# Patient Record
Sex: Male | Born: 1990 | Race: Black or African American | Hispanic: No | Marital: Single | Smoking: Never smoker
Health system: Southern US, Community
[De-identification: ages and names within clinical notes are randomized; demographics above are authoritative.]

## PROBLEM LIST (undated history)

## (undated) DIAGNOSIS — I10 Essential (primary) hypertension: Secondary | ICD-10-CM

---

## 2011-06-04 ENCOUNTER — Emergency Department (HOSPITAL_COMMUNITY)
Admission: EM | Admit: 2011-06-04 | Discharge: 2011-06-04 | Disposition: A | Discharge status: Home / Self Care | Payer: Medicaid Other | Attending: Emergency Medicine | Admitting: Emergency Medicine

## 2011-06-04 ENCOUNTER — Encounter (HOSPITAL_COMMUNITY): Payer: Self-pay | Admitting: *Deleted

## 2011-06-04 DIAGNOSIS — J40 Bronchitis, not specified as acute or chronic: Secondary | ICD-10-CM

## 2011-06-04 DIAGNOSIS — J45909 Unspecified asthma, uncomplicated: Secondary | ICD-10-CM | POA: Insufficient documentation

## 2011-06-04 DIAGNOSIS — I10 Essential (primary) hypertension: Secondary | ICD-10-CM | POA: Insufficient documentation

## 2011-06-04 HISTORY — DX: Essential (primary) hypertension: I10

## 2011-06-04 MED ORDER — ALBUTEROL SULFATE HFA 108 (90 BASE) MCG/ACT IN AERS
2.0000 | INHALATION_SPRAY | RESPIRATORY_TRACT | Status: DC | PRN
  Administered 2011-06-04: 2 via RESPIRATORY_TRACT
  Filled 2011-06-04: qty 6.7

## 2011-06-04 MED ORDER — ALBUTEROL SULFATE (5 MG/ML) 0.5% IN NEBU
5.0000 mg | INHALATION_SOLUTION | Freq: Once | RESPIRATORY_TRACT | Status: AC
  Administered 2011-06-04: 5 mg via RESPIRATORY_TRACT
  Filled 2011-06-04: qty 1

## 2011-06-04 MED ORDER — IPRATROPIUM BROMIDE 0.02 % IN SOLN
0.5000 mg | Freq: Once | RESPIRATORY_TRACT | Status: AC
  Administered 2011-06-04: 0.5 mg via RESPIRATORY_TRACT
  Filled 2011-06-04: qty 2.5

## 2011-06-04 NOTE — Discharge Instructions (Signed)
Bronchitis Bronchitis is the body's way of reacting to injury and/or infection (inflammation) of the bronchi. Bronchi are the air tubes that extend from the windpipe into the lungs. If the inflammation becomes severe, it may cause shortness of breath. CAUSES  Inflammation may be caused by:  A virus.   Germs (bacteria).   Dust.   Allergens.   Pollutants and many other irritants.  The cells lining the bronchial tree are covered with tiny hairs (cilia). These constantly beat upward, away from the lungs, toward the mouth. This keeps the lungs free of pollutants. When these cells become too irritated and are unable to do their job, mucus begins to develop. This causes the characteristic cough of bronchitis. The cough clears the lungs when the cilia are unable to do their job. Without either of these protective mechanisms, the mucus would settle in the lungs. Then you would develop pneumonia. Smoking is a common cause of bronchitis and can contribute to pneumonia. Stopping this habit is the single most important thing you can do to help yourself. TREATMENT   Your caregiver may prescribe an antibiotic if the cough is caused by bacteria. Also, medicines that open up your airways make it easier to breathe. Your caregiver may also recommend or prescribe an expectorant. It will loosen the mucus to be coughed up. Only take over-the-counter or prescription medicines for pain, discomfort, or fever as directed by your caregiver.   Removing whatever causes the problem (smoking, for example) is critical to preventing the problem from getting worse.   Cough suppressants may be prescribed for relief of cough symptoms.   Inhaled medicines may be prescribed to help with symptoms now and to help prevent problems from returning.   For those with recurrent (chronic) bronchitis, there may be a need for steroid medicines.  SEEK IMMEDIATE MEDICAL CARE IF:   During treatment, you develop more pus-like mucus  (purulent sputum).   You have a fever.   Your baby is older than 3 months with a rectal temperature of 102 F (38.9 C) or higher.   Your baby is 71 months old or younger with a rectal temperature of 100.4 F (38 C) or higher.   You become progressively more ill.   You have increased difficulty breathing, wheezing, or shortness of breath.  It is necessary to seek immediate medical care if you are elderly or sick from any other disease. MAKE SURE YOU:   Understand these instructions.   Will watch your condition.   Will get help right away if you are not doing well or get worse.  Document Released: 02/12/2005 Document Revised: 02/01/2011 Document Reviewed: 12/23/2007 [facility] Patient Information 2012 [place], [place].    RESOURCE GUIDE  Dental Problems  Patients with Medicaid: [facility] [phone] [address]                                           [phone] [address] Phone:  [phone]                                                  Phone:  [phone]  If unable to pay or uninsured, contact:  [facility] or [facility]. to become qualified for the adult dental clinic.  Chronic Pain Problems Contact [facility]  [phone] Patients need to be referred by their primary care doctor.  Insufficient Money for Medicine Contact [employer] Way:  call "211" or [facility] [phone]) [phone].  No Primary Care Doctor Call [facility]  541-342-3405 Other agencies that provide inexpensive medical care    [facility]  [phone]    [facility]  [phone]    [facility]  ([phone]    [facility]  [phone]    [facility]  858-705-2845    [facility]  [phone]  Psychological Services [facility]  [phone] [facility] Services  [phone] [facility]   2608702974 (emergency services 2281971172)  Substance Abuse  Resources [facility] and Drug Services  404-271-5750 [facility] (713)006-3603 [facility] [place] 412-501-5755 Floydene Flock 430-735-9336 Residential & Outpatient Substance Abuse Program  407-447-6732  Abuse/Neglect [facility] Child Abuse Hotline (825)210-6015 [facility] Child Abuse Hotline (478)095-9902 (After Hours)  Emergency Shelter [facility] 680-502-5119  M[facility] 704-192-9139 [facility] [phone]  MRSA Hotline #:   (260) 734-5431    [facility] Resources  [facility]     [employer]                          [facility] Dept. [address] [place]                       [address]      [address]  Blondell Reveal Phone:  [phone]                                   Phone:  [phone]                 Phone:  [phone]  [facility] Phone:  [phone]  [facility] Child Abuse Hotline 940-630-4891 210-206-4693 (After Hours)

## 2011-06-04 NOTE — ED Notes (Signed)
Pt  C/o a cough cold aching all over and chest congestion for 24 hours

## 2011-06-04 NOTE — ED Provider Notes (Signed)
History     CSN: [id number]  Arrival date & time 06/04/11  1854   First MD Initiated Contact with Patient 06/04/11 2304      Chief Complaint  Patient presents with  . Cough    HPI  History provided by the patient and significant other. Patient is a 21 year old male with history of asthma and hypertension presents with complaints of nasal congestion, sore throat and productive cough and began early this morning. Patient states that he had similar symptoms 2 weeks ago but they resolved. Symptoms began again acutely this morning. Patient took some NyQuil with relief of symptoms. Patient does report that his girlfriend has been sick with similar symptoms last week. Patient denies any other known sick contacts. Patient has not used any of his albuterol or asthma medications. Patient denies any associated fever, chills, sweats. Patient denies any nausea vomiting diarrhea. Symptoms are described as moderate. Patient denies any aggravating or alleviating factors.      Past Medical History  Diagnosis Date  . Asthma   . Hypertension     History reviewed. No pertinent past surgical history.  History reviewed. No pertinent family history.  History  Substance Use Topics  . Smoking status: Never Smoker   . Smokeless tobacco: Not on file  . Alcohol Use: No      Review of Systems  Constitutional: Positive for fatigue. Negative for fever, chills and appetite change.  HENT: Positive for congestion, sore throat and rhinorrhea. Negative for ear pain.   Respiratory: Positive for cough and shortness of breath.   Cardiovascular: Negative for chest pain.  Gastrointestinal: Negative for nausea, vomiting, abdominal pain and diarrhea.  Musculoskeletal: Positive for myalgias.  Skin: Negative for rash.    Allergies  Review of patient's allergies indicates no known allergies.  Home Medications  No current outpatient prescriptions on file.  BP 136/48  Pulse 67  Temp(Src) 98.5 F (36.9 C)  (Oral)  Resp 16  SpO2 100%  Physical Exam  Nursing note and vitals reviewed. Constitutional: He is oriented to person, place, and time. He appears well-developed and well-nourished. No distress.  HENT:  Head: Normocephalic and atraumatic.       Pharynx erythematous. No exudate. Tonsils normal size without swelling or exudate. Slight nasal congestion.  Eyes: Conjunctivae and EOM are normal. Pupils are equal, round, and reactive to light.  Neck: Normal range of motion. Neck supple.  Cardiovascular: Normal rate and regular rhythm.   Pulmonary/Chest: Effort normal. No respiratory distress. He has wheezes. He has no rales.  Abdominal: Soft. He exhibits no distension. There is no tenderness.  Lymphadenopathy:    He has no cervical adenopathy.  Neurological: He is alert and oriented to person, place, and time.  Skin: Skin is warm. No rash noted.  Psychiatric: He has a normal mood and affect. His behavior is normal.    ED Course  Procedures     1. Bronchitis       MDM  11:05 PM patient seen and evaluated. Patient in no acute distress.        Angus Seller, [place] 06/05/11 1913

## 2011-06-06 NOTE — ED Provider Notes (Signed)
Medical screening examination/treatment/procedure(s) were performed by non-physician practitioner and as supervising physician I was immediately available for consultation/collaboration.   Vida Roller, MD 06/06/11 [phone]

## 2013-10-22 ENCOUNTER — Emergency Department (HOSPITAL_COMMUNITY): Payer: Self-pay

## 2013-10-22 ENCOUNTER — Encounter (HOSPITAL_COMMUNITY): Payer: Self-pay | Admitting: Emergency Medicine

## 2013-10-22 ENCOUNTER — Emergency Department (HOSPITAL_COMMUNITY): Payer: Medicaid Other

## 2013-10-22 ENCOUNTER — Emergency Department (HOSPITAL_COMMUNITY)
Admission: EM | Admit: 2013-10-22 | Discharge: 2013-10-22 | Disposition: A | Discharge status: Home / Self Care | Payer: Self-pay | Attending: Emergency Medicine | Admitting: Emergency Medicine

## 2013-10-22 DIAGNOSIS — Y929 Unspecified place or not applicable: Secondary | ICD-10-CM | POA: Insufficient documentation

## 2013-10-22 DIAGNOSIS — S99929A Unspecified injury of unspecified foot, initial encounter: Secondary | ICD-10-CM

## 2013-10-22 DIAGNOSIS — S93609A Unspecified sprain of unspecified foot, initial encounter: Secondary | ICD-10-CM | POA: Insufficient documentation

## 2013-10-22 DIAGNOSIS — S93601A Unspecified sprain of right foot, initial encounter: Secondary | ICD-10-CM

## 2013-10-22 DIAGNOSIS — S8990XA Unspecified injury of unspecified lower leg, initial encounter: Secondary | ICD-10-CM | POA: Insufficient documentation

## 2013-10-22 DIAGNOSIS — X58XXXA Exposure to other specified factors, initial encounter: Secondary | ICD-10-CM | POA: Insufficient documentation

## 2013-10-22 DIAGNOSIS — Y939 Activity, unspecified: Secondary | ICD-10-CM | POA: Insufficient documentation

## 2013-10-22 DIAGNOSIS — I10 Essential (primary) hypertension: Secondary | ICD-10-CM | POA: Insufficient documentation

## 2013-10-22 DIAGNOSIS — S99919A Unspecified injury of unspecified ankle, initial encounter: Secondary | ICD-10-CM

## 2013-10-22 DIAGNOSIS — J45909 Unspecified asthma, uncomplicated: Secondary | ICD-10-CM | POA: Insufficient documentation

## 2013-10-22 MED ORDER — HYDROCODONE-ACETAMINOPHEN 5-325 MG PO TABS: 1.0000 | ORAL_TABLET | Freq: Four times a day (QID) | ORAL | Status: AC | PRN

## 2013-10-22 MED ORDER — HYDROCODONE-ACETAMINOPHEN 5-325 MG PO TABS
1.0000 | ORAL_TABLET | Freq: Once | ORAL | Status: AC
  Administered 2013-10-22: 1 via ORAL
  Filled 2013-10-22: qty 1

## 2013-10-22 NOTE — ED Notes (Signed)
Pt reports R foot injury on Monday. Swelling noted to extremity. Pt has brace on.

## 2013-10-22 NOTE — ED Provider Notes (Signed)
Medical screening examination/treatment/procedure(s) were performed by non-physician practitioner and as supervising physician I was immediately available for consultation/collaboration.    Vida Roller, MD 10/22/13 0630

## 2013-10-22 NOTE — Discharge Instructions (Signed)
Foot Sprain The muscles and cord like structures which attach muscle to bone (tendons) that surround the feet are made up of units. A foot sprain can occur at the weakest spot in any of these units. This condition is most often caused by injury to or overuse of the foot, as from playing contact sports, or aggravating a previous injury, or from poor conditioning, or obesity. SYMPTOMS  Pain with movement of the foot.  Tenderness and swelling at the injury site.  Loss of strength is present in moderate or severe sprains. THE THREE GRADES OR SEVERITY OF FOOT SPRAIN ARE:  Mild (Grade I): Slightly pulled muscle without tearing of muscle or tendon fibers or loss of strength.  Moderate (Grade II): Tearing of fibers in a muscle, tendon, or at the attachment to bone, with small decrease in strength.  Severe (Grade III): Rupture of the muscle-tendon-bone attachment, with separation of fibers. Severe sprain requires surgical repair. Often repeating (chronic) sprains are caused by overuse. Sudden (acute) sprains are caused by direct injury or over-use. DIAGNOSIS  Diagnosis of this condition is usually by your own observation. If problems continue, a caregiver may be required for further evaluation and treatment. X-rays may be required to make sure there are not breaks in the bones (fractures) present. Continued problems may require physical therapy for treatment. PREVENTION  Use strength and conditioning exercises appropriate for your sport.  Warm up properly prior to working out.  Use athletic shoes that are made for the sport you are participating in.  Allow adequate time for healing. Early return to activities makes repeat injury more likely, and can lead to an unstable arthritic foot that can result in prolonged disability. Mild sprains generally heal in 3 to 10 days, with moderate and severe sprains taking 2 to 10 weeks. Your caregiver can help you determine the proper time required for  healing. HOME CARE INSTRUCTIONS   Apply ice to the injury for 15-20 minutes, 03-04 times per day. Put the ice in a plastic bag and place a towel between the bag of ice and your skin.  An elastic wrap (like an Ace bandage) may be used to keep swelling down.  Keep foot above the level of the heart, or at least raised on a footstool, when swelling and pain are present.  Try to avoid use other than gentle range of motion while the foot is painful. Do not resume use until instructed by your caregiver. Then begin use gradually, not increasing use to the point of pain. If pain does develop, decrease use and continue the above measures, gradually increasing activities that do not cause discomfort, until you gradually achieve normal use.  Use crutches if and as instructed, and for the length of time instructed.  Keep injured foot and ankle wrapped between treatments.  Massage foot and ankle for comfort and to keep swelling down. Massage from the toes up towards the knee.  Only take over-the-counter or prescription medicines for pain, discomfort, or fever as directed by your caregiver. SEEK IMMEDIATE MEDICAL CARE IF:   Your pain and swelling increase, or pain is not controlled with medications.  You have loss of feeling in your foot or your foot turns cold or blue.  You develop new, unexplained symptoms, or an increase of the symptoms that brought you to your caregiver. MAKE SURE YOU:   Understand these instructions.  Will watch your condition.  Will get help right away if you are not doing well or get worse. Document Released:   08/04/2001 Document Revised: 05/07/2011 Document Reviewed: 10/02/2007 [employer] Patient Information 2015 [employer]. This information is not intended to replace advice given to you by your health care provider. Make sure you discuss any questions you have with your health care provider.  

## 2013-10-22 NOTE — Progress Notes (Signed)
Orthopedic Tech Progress Note Patient Details:  Charles Rocha 12-22-1990 [id number] Applied ASO to RLE.  Pulses, sensation, motion intact before and after application.  Capillary refill less than 2 seconds before and after application.  Fit crutches and taught pt. use of same. Ortho Devices Type of Ortho Device: ASO Ortho Device/Splint Location: RLE Ortho Device/Splint Interventions: Application   [name] 10/22/2013, 5:22 AM

## 2013-10-22 NOTE — ED Provider Notes (Signed)
CSN: [id number]     Arrival date & time 10/22/13  0156 History   First MD Initiated Contact with Patient 10/22/13 0304     Chief Complaint  Patient presents with  . Foot Injury     (Consider location/radiation/quality/duration/timing/severity/associated sxs/prior Treatment) HPI Comments: Rolled his ankle on Monday playing BB has been using RICE and Ibuprofen with little relief  Now with increases swelling and pain   Patient is a 23 y.o. male presenting with foot injury. The history is provided by the patient.  Foot Injury Location:  Ankle and foot Time since incident:  4 days Injury: yes   Mechanism of injury: fall   Fall:    Fall occurred:  Standing   Impact surface:  Athletic surface   Entrapped after fall: no   Ankle location:  R ankle Foot location:  R foot Pain details:    Quality:  Aching and throbbing   Radiates to:  Does not radiate   Severity:  Moderate   Onset quality:  Sudden   Duration:  4 days   Timing:  Constant   Progression:  Worsening Chronicity:  New Dislocation: no   Foreign body present:  No foreign bodies Prior injury to area:  Yes Relieved by:  Nothing Worsened by:  Activity Ineffective treatments:  Ice, immobilization, rest, elevation and compression Associated symptoms: swelling   Associated symptoms: no fever     Past Medical History  Diagnosis Date  . Asthma   . Hypertension    History reviewed. No pertinent past surgical history. No family history on file. History  Substance Use Topics  . Smoking status: Never Smoker   . Smokeless tobacco: Not on file  . Alcohol Use: No    Review of Systems  Constitutional: Negative for fever.  Musculoskeletal: Positive for joint swelling.  Skin: Negative for wound.  Neurological: Negative for dizziness and numbness.  All other systems reviewed and are negative.     Allergies  Review of patient's allergies indicates no known allergies.  Home Medications   Prior to Admission medications    Medication Sig Start Date End Date Taking? Authorizing Provider  albuterol (PROVENTIL HFA;VENTOLIN HFA) 108 (90 BASE) MCG/ACT inhaler Inhale 1 puff into the lungs every 6 (six) hours as needed for wheezing or shortness of breath.   Yes Historical Provider, MD  Aspirin-Salicylamide-Caffeine (BC HEADACHE PO) Take 1 packet by mouth every 6 (six) hours as needed (for pain).   Yes Historical Provider, MD  HYDROcodone-acetaminophen (NORCO/VICODIN) 5-325 MG per tablet Take 1 tablet by mouth every 6 (six) hours as needed for moderate pain. 10/22/13   Arman Filter, NP   BP 142/61  Pulse 50  Temp(Src) 97.5 F (36.4 C) (Oral)  Resp 18  SpO2 99% Physical Exam  Nursing note and vitals reviewed. Constitutional: He appears well-developed and well-nourished.  HENT:  Head: Normocephalic.  Eyes: Pupils are equal, round, and reactive to light.  Neck: Normal range of motion.  Cardiovascular: Normal rate.   Pulmonary/Chest: Effort normal.  Musculoskeletal: He exhibits tenderness. He exhibits no edema.       Right ankle: He exhibits decreased range of motion and swelling. He exhibits no deformity, no laceration and normal pulse. Tenderness. Lateral malleolus and medial malleolus tenderness found. Achilles tendon normal. Achilles tendon exhibits no pain.  Neurological: He is alert.  Skin: Skin is warm. No erythema.    ED Course  Procedures (including critical care time) Labs Review Labs Reviewed - No data to display  Imaging  Review Dg Ankle Complete Right  10/22/2013   CLINICAL DATA:  Ankle pain after injury.  EXAM: RIGHT ANKLE - COMPLETE 3+ VIEW  COMPARISON:  None.  FINDINGS: Old appearing ununited ossicle inferior to the lateral malleolus. Mild soft tissue swelling. Multiple bone fragments over the distal talus may represent avulsion fragments. Overlying soft tissue swelling is present. Plantar calcaneal spur. No displaced fractures identified. Ankle mortise and talar dome appear intact.  IMPRESSION:  Osseous fragments over the distal talus may represent avulsion fracture fragments. Soft tissue swelling.   Electronically Signed   By: Burman Nieves M.D.   On: 10/22/2013 03:40   Dg Foot Complete Right  10/22/2013   CLINICAL DATA:  Right foot pain following injury.  EXAM: RIGHT FOOT COMPLETE - 3+ VIEW  COMPARISON:  None.  FINDINGS: Small dorsal bone fragments at the level of the distal talus and proximal navicular. These appear corticated without overlying focal soft tissue swelling. There is mild diffuse dorsal soft tissue swelling, more pronounced distally. Mild inferior calcaneal spur formation.  IMPRESSION: Probable old fracture fragments dorsal to the proximal tarsal bones. No definite acute fracture.   Electronically Signed   By: Gordan Payment M.D.   On: 10/22/2013 02:42     EKG Interpretation None      MDM   Final diagnoses:  Foot sprain, right, initial encounter   Patient has been placed in ASO for support given Rx for Hydrocodone  and crutches       Arman Filter, NP 10/22/13 7829  Arman Filter, NP 10/22/13 [phone]

## 2013-10-27 ENCOUNTER — Telehealth (HOSPITAL_COMMUNITY): Payer: Self-pay

## 2013-10-27 ENCOUNTER — Encounter (HOSPITAL_COMMUNITY): Payer: Self-pay | Admitting: Emergency Medicine

## 2013-10-27 ENCOUNTER — Emergency Department (HOSPITAL_COMMUNITY)
Admission: EM | Admit: 2013-10-27 | Discharge: 2013-10-27 | Disposition: A | Discharge status: Home / Self Care | Payer: Medicaid Other | Attending: Emergency Medicine | Admitting: Emergency Medicine

## 2013-10-27 DIAGNOSIS — M25571 Pain in right ankle and joints of right foot: Secondary | ICD-10-CM

## 2013-10-27 DIAGNOSIS — Z7982 Long term (current) use of aspirin: Secondary | ICD-10-CM | POA: Insufficient documentation

## 2013-10-27 DIAGNOSIS — M25579 Pain in unspecified ankle and joints of unspecified foot: Secondary | ICD-10-CM | POA: Insufficient documentation

## 2013-10-27 DIAGNOSIS — Z79899 Other long term (current) drug therapy: Secondary | ICD-10-CM | POA: Insufficient documentation

## 2013-10-27 DIAGNOSIS — Z0289 Encounter for other administrative examinations: Secondary | ICD-10-CM | POA: Insufficient documentation

## 2013-10-27 DIAGNOSIS — I10 Essential (primary) hypertension: Secondary | ICD-10-CM | POA: Insufficient documentation

## 2013-10-27 DIAGNOSIS — J45909 Unspecified asthma, uncomplicated: Secondary | ICD-10-CM | POA: Insufficient documentation

## 2013-10-27 MED ORDER — HYDROCODONE-ACETAMINOPHEN 5-325 MG PO TABS
1.0000 | ORAL_TABLET | Freq: Once | ORAL | Status: AC
  Administered 2013-10-27: 1 via ORAL
  Filled 2013-10-27: qty 1
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
## 2013-10-27 NOTE — Discharge Instructions (Signed)
Wear ankle brace for at least 2 weeks for stabilization of ankle. Use crutches as needed for comfort. Ice and elevate ankle throughout the day. Alternate between ibuprofen and tylenol for pain. Call orthopedic follow up as directed by the provider you saw previously. Return to the ER for changes or worsening symptoms.    Ankle Pain Ankle pain is a common symptom. The bones, cartilage, tendons, and muscles of the ankle joint perform a lot of work each day. The ankle joint holds your body weight and allows you to move around. Ankle pain can occur on either side or back of 1 or both ankles. Ankle pain may be sharp and burning or dull and aching. There may be tenderness, stiffness, redness, or warmth around the ankle. The pain occurs more often when a person walks or puts pressure on the ankle. CAUSES  There are many reasons ankle pain can develop. It is important to work with your caregiver to identify the cause since many conditions can impact the bones, cartilage, muscles, and tendons. Causes for ankle pain include:  Injury, including a break (fracture), sprain, or strain often due to a fall, sports, or a high-impact activity.  Swelling (inflammation) of a tendon (tendonitis).  Achilles tendon rupture.  Ankle instability after repeated sprains and strains.  Poor foot alignment.  Pressure on a nerve (tarsal tunnel syndrome).  Arthritis in the ankle or the lining of the ankle.  Crystal formation in the ankle (gout or pseudogout). DIAGNOSIS  A diagnosis is based on your medical history, your symptoms, results of your physical exam, and results of diagnostic tests. Diagnostic tests may include X-ray exams or a computerized magnetic scan (magnetic resonance imaging, MRI). TREATMENT  Treatment will depend on the cause of your ankle pain and may include:  Keeping pressure off the ankle and limiting activities.  Using crutches or other walking support (a cane or brace).  Using rest, ice,  compression, and elevation.  Participating in physical therapy or home exercises.  Wearing shoe inserts or special shoes.  Losing weight.  Taking medications to reduce pain or swelling or receiving an injection.  Undergoing surgery. HOME CARE INSTRUCTIONS   Only take over-the-counter or prescription medicines for pain, discomfort, or fever as directed by your caregiver.  Put ice on the injured area.  Put ice in a plastic bag.  Place a towel between your skin and the bag.  Leave the ice on for 15-20 minutes at a time, 03-04 times a day.  Keep your leg raised (elevated) when possible to lessen swelling.  Avoid activities that cause ankle pain.  Follow specific exercises as directed by your caregiver.  Record how often you have ankle pain, the location of the pain, and what it feels like. This information may be helpful to you and your caregiver.  Ask your caregiver about returning to work or sports and whether you should drive.  Follow up with your caregiver for further examination, therapy, or testing as directed. SEEK MEDICAL CARE IF:   Pain or swelling continues or worsens beyond 1 week.  You have an oral temperature above 102 F (38.9 C).  You are feeling unwell or have chills.  You are having an increasingly difficult time with walking.  You have loss of sensation or other new symptoms.  You have questions or concerns. MAKE SURE YOU:   Understand these instructions.  Will watch your condition.  Will get help right away if you are not doing well or get worse. Document Released:  08/02/2009 Document Revised: 05/07/2011 Document Reviewed: 08/02/2009 ExitCare Patient Information 2015 [place], [place]. This information is not intended to replace advice given to you by your health care provider. Make sure you discuss any questions you have with your health care provider.  Cryotherapy Cryotherapy is when you put ice on your injury. Ice helps lessen pain and  puffiness (swelling) after an injury. Ice works the best when you start using it in the first 24 to 48 hours after an injury. HOME CARE  Put a dry or damp towel between the ice pack and your skin.  You may press gently on the ice pack.  Leave the ice on for no more than 10 to 20 minutes at a time.  Check your skin after 5 minutes to make sure your skin is okay.  Rest at least 20 minutes between ice pack uses.  Stop using ice when your skin loses feeling (numbness).  Do not use ice on someone who cannot tell you when it hurts. This includes small children and people with memory problems (dementia). GET HELP RIGHT AWAY IF:  You have white spots on your skin.  Your skin turns blue or pale.  Your skin feels waxy or hard.  Your puffiness gets worse. MAKE SURE YOU:   Understand these instructions.  Will watch your condition.  Will get help right away if you are not doing well or get worse. Document Released: 08/01/2007 Document Revised: 05/07/2011 Document Reviewed: 10/05/2010 [facility] Patient Information 2015 [place], [place]. This information is not intended to replace advice given to you by your health care provider. Make sure you discuss any questions you have with your health care provider.

## 2013-10-27 NOTE — ED Notes (Signed)
Pt seen here for right foot injury on 8/27. States she was given a work note that covered him until today, but pt doesn't feel like he is able to return at this time. Pt requesting a longer work note. Pt denies any changes in foot. States he never filled pain prescription because he was unable to afford it. Pt in NAD. AO x 4.

## 2013-10-28 NOTE — ED Provider Notes (Signed)
Medical screening examination/treatment/procedure(s) were performed by non-physician practitioner and as supervising physician I was immediately available for consultation/collaboration.   EKG Interpretation None       Shamir Tuzzolino, MD 10/28/13 0153 

## 2013-11-10 ENCOUNTER — Telehealth (HOSPITAL_BASED_OUTPATIENT_CLINIC_OR_DEPARTMENT_OTHER): Payer: Self-pay | Admitting: Emergency Medicine

## 2014-02-06 ENCOUNTER — Emergency Department (HOSPITAL_COMMUNITY)
Admission: EM | Admit: 2014-02-06 | Discharge: 2014-02-06 | Disposition: A | Discharge status: Home / Self Care | Payer: Medicaid Other | Attending: Emergency Medicine | Admitting: Emergency Medicine

## 2014-02-06 ENCOUNTER — Encounter (HOSPITAL_COMMUNITY): Payer: Self-pay | Admitting: Emergency Medicine

## 2014-02-06 DIAGNOSIS — Y998 Other external cause status: Secondary | ICD-10-CM | POA: Insufficient documentation

## 2014-02-06 DIAGNOSIS — R001 Bradycardia, unspecified: Secondary | ICD-10-CM | POA: Insufficient documentation

## 2014-02-06 DIAGNOSIS — S0181XA Laceration without foreign body of other part of head, initial encounter: Secondary | ICD-10-CM

## 2014-02-06 DIAGNOSIS — J45909 Unspecified asthma, uncomplicated: Secondary | ICD-10-CM | POA: Insufficient documentation

## 2014-02-06 DIAGNOSIS — I10 Essential (primary) hypertension: Secondary | ICD-10-CM | POA: Insufficient documentation

## 2014-02-06 DIAGNOSIS — W01198A Fall on same level from slipping, tripping and stumbling with subsequent striking against other object, initial encounter: Secondary | ICD-10-CM | POA: Insufficient documentation

## 2014-02-06 DIAGNOSIS — W108XXA Fall (on) (from) other stairs and steps, initial encounter: Secondary | ICD-10-CM | POA: Insufficient documentation

## 2014-02-06 DIAGNOSIS — Z79899 Other long term (current) drug therapy: Secondary | ICD-10-CM | POA: Insufficient documentation

## 2014-02-06 DIAGNOSIS — S40211A Abrasion of right shoulder, initial encounter: Secondary | ICD-10-CM | POA: Insufficient documentation

## 2014-02-06 DIAGNOSIS — W25XXXA Contact with sharp glass, initial encounter: Secondary | ICD-10-CM | POA: Insufficient documentation

## 2014-02-06 DIAGNOSIS — T07XXXA Unspecified multiple injuries, initial encounter: Secondary | ICD-10-CM

## 2014-02-06 DIAGNOSIS — Z23 Encounter for immunization: Secondary | ICD-10-CM | POA: Insufficient documentation

## 2014-02-06 DIAGNOSIS — S01411A Laceration without foreign body of right cheek and temporomandibular area, initial encounter: Secondary | ICD-10-CM | POA: Insufficient documentation

## 2014-02-06 DIAGNOSIS — Y9389 Activity, other specified: Secondary | ICD-10-CM | POA: Insufficient documentation

## 2014-02-06 DIAGNOSIS — Y9289 Other specified places as the place of occurrence of the external cause: Secondary | ICD-10-CM | POA: Insufficient documentation

## 2014-02-06 MED ORDER — IBUPROFEN 400 MG PO TABS
600.0000 mg | ORAL_TABLET | Freq: Once | ORAL | Status: AC
  Administered 2014-02-06: 600 mg via ORAL
  Filled 2014-02-06 (×2): qty 1

## 2014-02-06 MED ORDER — TRAMADOL HCL 50 MG PO TABS: 50.0000 mg | ORAL_TABLET | Freq: Four times a day (QID) | ORAL | Status: AC | PRN

## 2014-02-06 MED ORDER — TETANUS-DIPHTH-ACELL PERTUSSIS 5-2.5-18.5 LF-MCG/0.5 IM SUSP
0.5000 mL | Freq: Once | INTRAMUSCULAR | Status: AC
  Administered 2014-02-06: 0.5 mL via INTRAMUSCULAR
  Filled 2014-02-06: qty 0.5

## 2014-02-06 MED ORDER — LORAZEPAM 1 MG PO TABS
1.0000 mg | ORAL_TABLET | Freq: Once | ORAL | Status: AC
  Administered 2014-02-06: 1 mg via ORAL
  Filled 2014-02-06: qty 1

## 2014-02-06 MED ORDER — OXYCODONE-ACETAMINOPHEN 5-325 MG PO TABS
2.0000 | ORAL_TABLET | Freq: Once | ORAL | Status: AC
  Administered 2014-02-06: 2 via ORAL
  Filled 2014-02-06: qty 2

## 2014-02-06 MED ORDER — LIDOCAINE-EPINEPHRINE (PF) 2 %-1:200000 IJ SOLN
10.0000 mL | Freq: Once | INTRAMUSCULAR | Status: AC
  Administered 2014-02-06: 10 mL
  Filled 2014-02-06: qty 20

## 2014-02-06 MED ORDER — BACITRACIN ZINC 500 UNIT/GM EX OINT
TOPICAL_OINTMENT | Freq: Two times a day (BID) | CUTANEOUS | Status: DC
Start: 2014-02-06 — End: 2014-02-06
  Administered 2014-02-06: 1 via TOPICAL

## 2014-02-06 NOTE — ED Notes (Addendum)
Pt reports tripping and falling down stairs over some trash at the bottom of the stairs this morning.  Pt reports he tried to catch himself.  Pt reports he fell on some broken glass bottles.  Pt has abrasion to right shoulder, on posterior hands, and under lower left lip, on upper left lip, abover upper left lip, and a gash about 3cm long under right eye. Pt alert and oriented. Pt reports he partied last night. Bleeding is controlled.

## 2014-02-06 NOTE — Discharge Instructions (Signed)
Facial Laceration ° A facial laceration is a cut on the face. These injuries can be painful and cause bleeding. Lacerations usually heal quickly, but they need special care to reduce scarring. °DIAGNOSIS  °Your health care provider will take a medical history, ask for details about how the injury occurred, and examine the wound to determine how deep the cut is. °TREATMENT  °Some facial lacerations may not require closure. Others may not be able to be closed because of an increased risk of infection. The risk of infection and the chance for successful closure will depend on various factors, including the amount of time since the injury occurred. °The wound may be cleaned to help prevent infection. If closure is appropriate, pain medicines may be given if needed. Your health care provider will use stitches (sutures), wound glue (adhesive), or skin adhesive strips to repair the laceration. These tools bring the skin edges together to allow for faster healing and a better cosmetic outcome. If needed, you may also be given a tetanus shot. °HOME CARE INSTRUCTIONS °· Only take over-the-counter or prescription medicines as directed by your health care provider. °· Follow your health care provider's instructions for wound care. These instructions will vary depending on the technique used for closing the wound. °For Sutures: °· Keep the wound clean and dry.   °· If you were given a bandage (dressing), you should change it at least once a day. Also change the dressing if it becomes wet or dirty, or as directed by your health care provider.   °· Wash the wound with soap and water 2 times a day. Rinse the wound off with water to remove all soap. Pat the wound dry with a clean towel.   °· After cleaning, apply a thin layer of the antibiotic ointment recommended by your health care provider. This will help prevent infection and keep the dressing from sticking.   °· You may shower as usual after the first 24 hours. Do not soak the  wound in water until the sutures are removed.   °· Get your sutures removed as directed by your health care provider. With facial lacerations, sutures should usually be taken out after 4-5 days to avoid stitch marks.   °· Wait a few days after your sutures are removed before applying any makeup. °For Skin Adhesive Strips: °· Keep the wound clean and dry.   °· Do not get the skin adhesive strips wet. You may bathe carefully, using caution to keep the wound dry.   °· If the wound gets wet, pat it dry with a clean towel.   °· Skin adhesive strips will fall off on their own. You may trim the strips as the wound heals. Do not remove skin adhesive strips that are still stuck to the wound. They will fall off in time.   °For Wound Adhesive: °· You may briefly wet your wound in the shower or bath. Do not soak or scrub the wound. Do not swim. Avoid periods of heavy sweating until the skin adhesive has fallen off on its own. After showering or bathing, gently pat the wound dry with a clean towel.   °· Do not apply liquid medicine, cream medicine, ointment medicine, or makeup to your wound while the skin adhesive is in place. This may loosen the film before your wound is healed.   °· If a dressing is placed over the wound, be careful not to apply tape directly over the skin adhesive. This may cause the adhesive to be pulled off before the wound is healed.   °· Avoid   prolonged exposure to sunlight or tanning lamps while the skin adhesive is in place.  The skin adhesive will usually remain in place for 5-10 days, then naturally fall off the skin. Do not pick at the adhesive film.  After Healing: Once the wound has healed, cover the wound with sunscreen during the day for 1 full year. This can help minimize scarring. Exposure to ultraviolet light in the first year will darken the scar. It can take 1-2 years for the scar to lose its redness and to heal completely.  SEEK IMMEDIATE MEDICAL CARE IF:  You have redness, pain, or  swelling around the wound.   You see ayellowish-white fluid (pus) coming from the wound.   You have chills or a fever.  MAKE SURE YOU:  Understand these instructions.  Will watch your condition.  Will get help right away if you are not doing well or get worse. Document Released: 03/22/2004 Document Revised: 12/03/2012 Document Reviewed: 09/25/2012 [facility] Patient Information 2015 [place], [place]. This information is not intended to replace advice given to you by your health care provider. Make sure you discuss any questions you have with your health care provider.  Abrasion An abrasion is a cut or scrape of the skin. Abrasions do not extend through all layers of the skin and most heal within 10 days. It is important to care for your abrasion properly to prevent infection. CAUSES  Most abrasions are caused by falling on, or gliding across, the ground or other surface. When your skin rubs on something, the outer and inner layer of skin rubs off, causing an abrasion. DIAGNOSIS  Your caregiver will be able to diagnose an abrasion during a physical exam.  TREATMENT  Your treatment depends on how large and deep the abrasion is. Generally, your abrasion will be cleaned with water and a mild soap to remove any dirt or debris. An antibiotic ointment may be put over the abrasion to prevent an infection. A bandage (dressing) may be wrapped around the abrasion to keep it from getting dirty.  You may need a tetanus shot if:  You cannot remember when you had your last tetanus shot.  You have never had a tetanus shot.  The injury broke your skin. If you get a tetanus shot, your arm may swell, get red, and feel warm to the touch. This is common and not a problem. If you need a tetanus shot and you choose not to have one, there is a rare chance of getting tetanus. Sickness from tetanus can be serious.  HOME CARE INSTRUCTIONS   If a dressing was applied, change it at least once a day or as  directed by your caregiver. If the bandage sticks, soak it off with warm water.   Wash the area with water and a mild soap to remove all the ointment 2 times a day. Rinse off the soap and pat the area dry with a clean towel.   Reapply any ointment as directed by your caregiver. This will help prevent infection and keep the bandage from sticking. Use gauze over the wound and under the dressing to help keep the bandage from sticking.   Change your dressing right away if it becomes wet or dirty.   Only take over-the-counter or prescription medicines for pain, discomfort, or fever as directed by your caregiver.   Follow up with your caregiver within 24-48 hours for a wound check, or as directed. If you were not given a wound-check appointment, look closely at your  abrasion for redness, swelling, or pus. These are signs of infection. SEEK IMMEDIATE MEDICAL CARE IF:   You have increasing pain in the wound.   You have redness, swelling, or tenderness around the wound.   You have pus coming from the wound.   You have a fever or persistent symptoms for more than 2-3 days.  You have a fever and your symptoms suddenly get worse.  You have a bad smell coming from the wound or dressing.  MAKE SURE YOU:   Understand these instructions.  Will watch your condition.  Will get help right away if you are not doing well or get worse. Document Released: 11/22/2004 Document Revised: 01/30/2012 Document Reviewed: 01/16/2011 [facility] Patient Information 2015 [place], [place]. This information is not intended to replace advice given to you by your health care provider. Make sure you discuss any questions you have with your health care provider.

## 2014-02-06 NOTE — ED Notes (Addendum)
Pt reports tripping and falling down stairs over some trash at the bottom of the stairs this morning.  Pt reports he tried to catch himself.  Pt reports he fell on some broken glass bottles.  Pt has abrasion to right shoulder, on posterior hands, and under lower left lip, on upper left lip, abover upper left lip, and a gash about 3cm long under right eye. Pt alert and oriented. Pt reports he partied last night.

## 2014-02-06 NOTE — ED Provider Notes (Signed)
CSN: [id number]     Arrival date & time 02/06/14  1103 History   First MD Initiated Contact with Patient 02/06/14 1110     Chief Complaint  Patient presents with  . Fall  . Facial Injury     (Consider location/radiation/quality/duration/timing/severity/associated sxs/prior Treatment) HPI   23yM presenting after fall. Tripped coming down steps and tumbled down 3/4 flight. Did strike head. No LOC. C/o facial pain and R shoulder pain. No neck or back pain. No acute visual complaints. No n/v. No acute numbness, tingling or loss of strength. Unsure of tetanus. No intervention prior to arrival.   Past Medical History  Diagnosis Date  . Asthma   . Hypertension    History reviewed. No pertinent past surgical history. History reviewed. No pertinent family history. History  Substance Use Topics  . Smoking status: Never Smoker   . Smokeless tobacco: Not on file  . Alcohol Use: No    Review of Systems  All systems reviewed and negative, other than as noted in HPI.   Allergies  Review of patient's allergies indicates no known allergies.  Home Medications   Prior to Admission medications   Medication Sig Start Date End Date Taking? Authorizing Provider  albuterol (PROVENTIL HFA;VENTOLIN HFA) 108 (90 BASE) MCG/ACT inhaler Inhale 1 puff into the lungs every 6 (six) hours as needed for wheezing or shortness of breath.    Historical Provider, MD  Aspirin-Salicylamide-Caffeine (BC HEADACHE PO) Take 1 packet by mouth every 6 (six) hours as needed (for pain).    Historical Provider, MD  HYDROcodone-acetaminophen (NORCO/VICODIN) 5-325 MG per tablet Take 1 tablet by mouth every 6 (six) hours as needed for moderate pain. 10/22/13   Arman FilterGail K Schulz, NP   BP 137/77 mmHg  Pulse 47  Temp(Src) 98.4 F (36.9 C)  Resp 14  Ht 6\' 2"  (1.88 m)  Wt 230 lb (104.327 kg)  BMI 29.52 kg/m2  SpO2 100% Physical Exam  Constitutional: He is oriented to person, place, and time. He appears well-developed and  well-nourished. No distress.  HENT:  Head: Normocephalic and atraumatic.    Eyes: Conjunctivae are normal. Right eye exhibits no discharge. Left eye exhibits no discharge.  Neck: Neck supple.  Cardiovascular: Regular rhythm and normal heart sounds.  Exam reveals no gallop and no friction rub.   No murmur heard. bradycardia  Pulmonary/Chest: Effort normal and breath sounds normal. No respiratory distress.  Abdominal: Soft. He exhibits no distension. There is no tenderness.  Musculoskeletal: He exhibits no edema or tenderness.       Arms: Scattered abrasions to b/l hands. Mild bony tenderness R shoulder. Able to actively range. NVI distally. No midline spinal tenderness.   Neurological: He is alert and oriented to person, place, and time. No cranial nerve deficit. He exhibits normal muscle tone. Coordination normal.  Skin: Skin is warm and dry.  Psychiatric: He has a normal mood and affect. His behavior is normal. Thought content normal.  Nursing note and vitals reviewed.   ED Course  Procedures (including critical care time)  LACERATION REPAIR Performed by: Raeford RazorKOHUT, Marchello Rothgeb Authorized by: Raeford RazorKOHUT, Graclynn Vanantwerp Consent: Verbal consent obtained. Risks and benefits: risks, benefits and alternatives were discussed Consent given by: patient Patient identity confirmed: provided demographic data Prepped and Draped in normal sterile fashion Wound explored  Laceration Location: R cheek  Laceration Length: 3 cm  No Foreign Bodies seen or palpated  Anesthesia: local infiltration  Local anesthetic: lidocaine 2% w epinephrine  Anesthetic total: 2 ml  Irrigation  method: syringe Amount of cleaning: standard  Skin closure: 6-0 plain gut  Number of sutures: 8  Technique: simple interupted  Patient tolerance: Patient tolerated the procedure well with no immediate complications. Labs Review Labs Reviewed - No data to display  Imaging Review No results found.   EKG  Interpretation None      MDM   Final diagnoses:  Facial laceration, initial encounter  Abrasions of multiple sites    23yM with abrasions/facial laceration after fall. Nonfocal neuro exam. No midline spinal tenderness. No exam findings to suggest fx/significant orthopedic injury. Facial laceration closed. Continued wound care. Return precautions discussed.     Raeford RazorStephen Lonetta Blassingame, MD 02/09/14 [phone]

## 2014-02-06 NOTE — ED Notes (Signed)
Pt made aware to return if symptoms worsen or if any life threatening symptoms occur.   

## 2015-04-12 IMAGING — CR DG FOOT COMPLETE 3+V*R*
3 series · 3 of 3 positions shown · non-contrast
Comparison: None.

CLINICAL DATA: Right foot pain following injury.

EXAM:
RIGHT FOOT COMPLETE - 3+ VIEW

[t foot ap right]
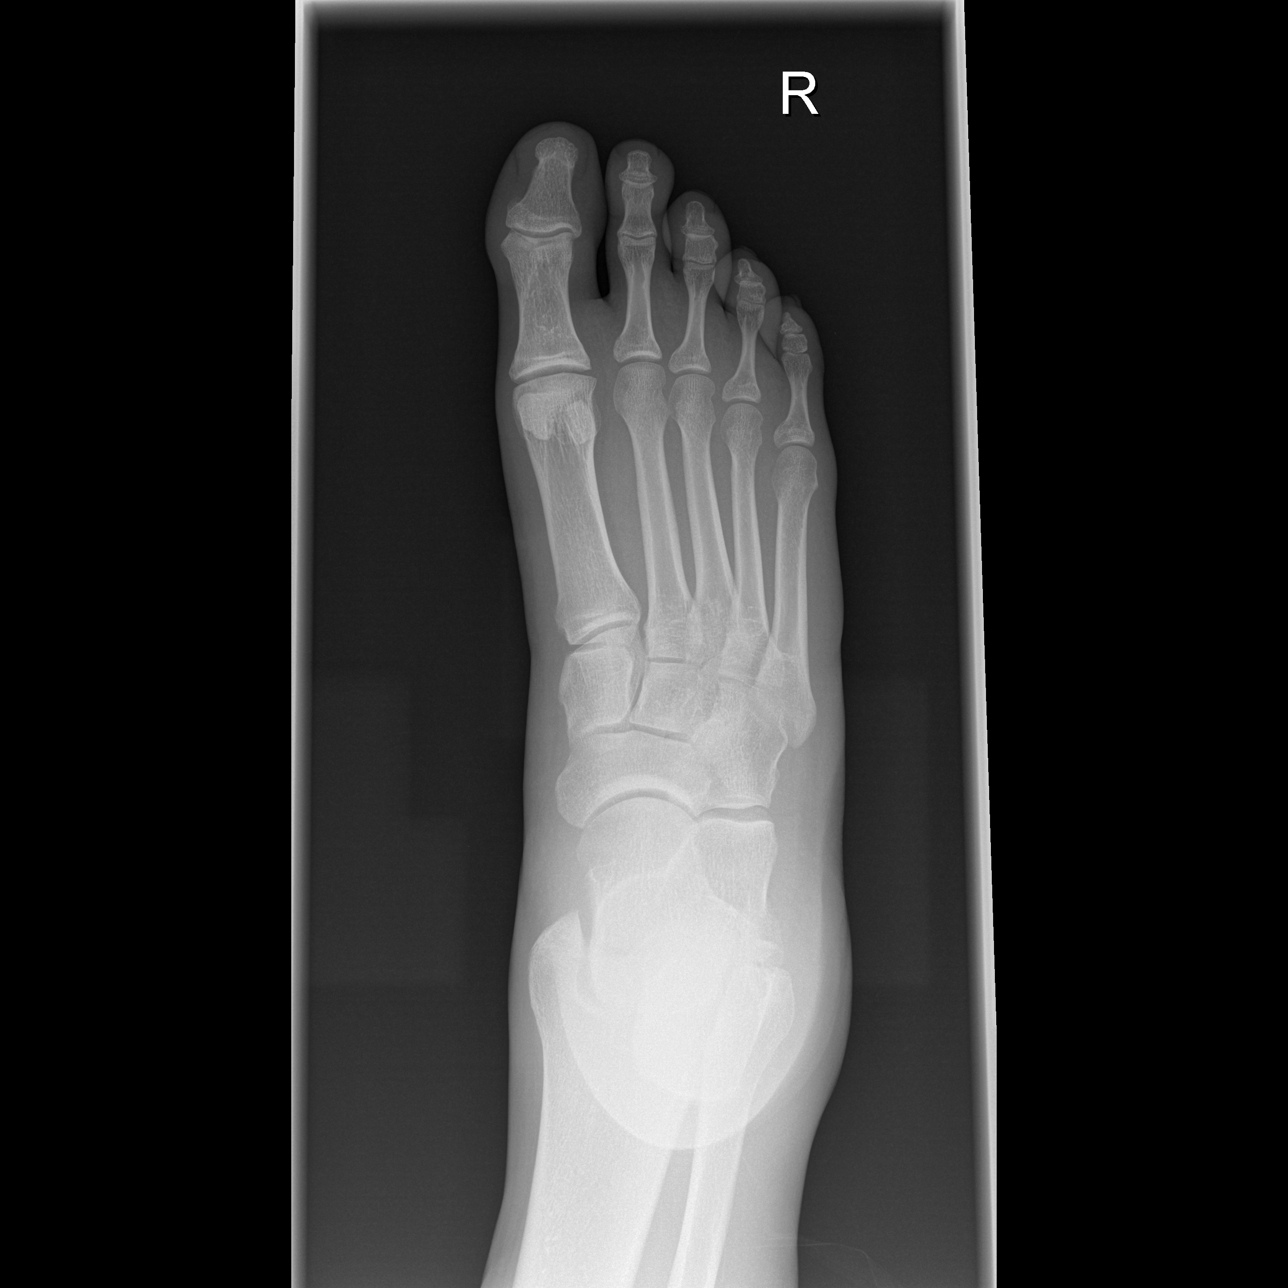

[t foot oblique right]
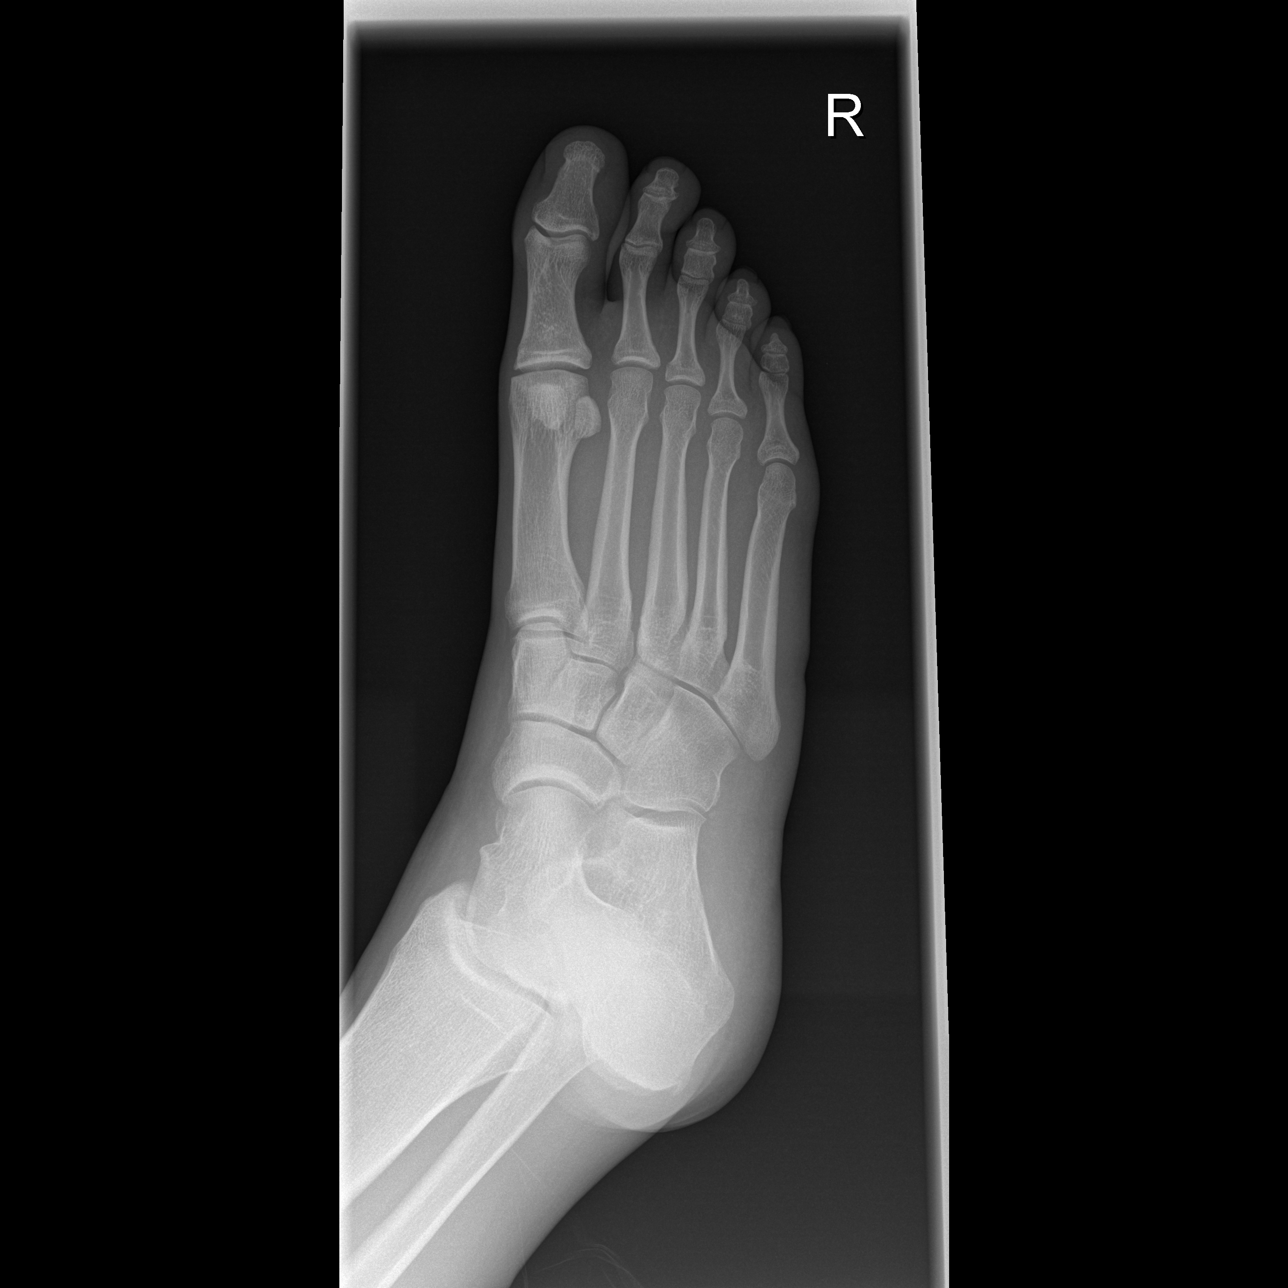

[t foot lat right]
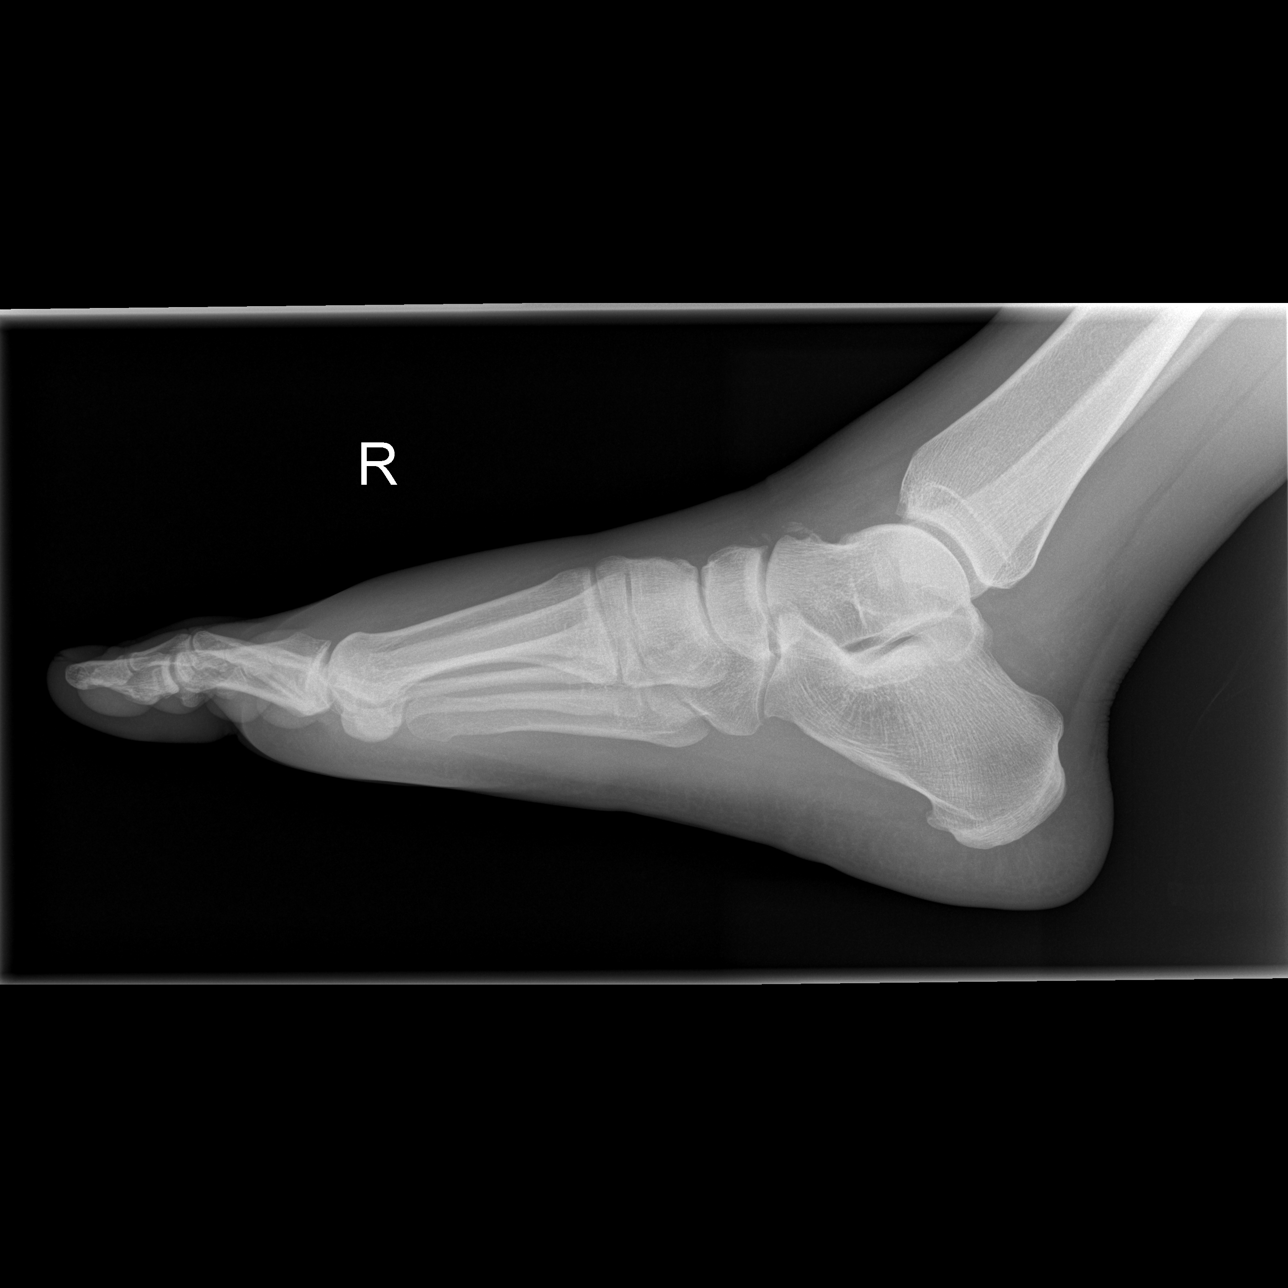

[3 of 3 positions shown; findings below may reference images not displayed]

FINDINGS: Small dorsal bone fragments at the level of the distal talus and
proximal navicular. These appear corticated without overlying focal
soft tissue swelling. There is mild diffuse dorsal soft tissue
swelling, more pronounced distally. Mild inferior calcaneal spur
formation.
IMPRESSION: Probable old fracture fragments dorsal to the proximal tarsal bones.
No definite acute fracture.

## 2024-02-27 ENCOUNTER — Emergency Department (HOSPITAL_COMMUNITY): Payer: Self-pay

## 2024-02-27 ENCOUNTER — Emergency Department (HOSPITAL_COMMUNITY)
Admission: EM | Admit: 2024-02-27 | Discharge: 2024-02-27 | Disposition: A | Discharge status: Home / Self Care | Payer: Self-pay | Attending: Emergency Medicine | Admitting: Emergency Medicine

## 2024-02-27 DIAGNOSIS — R030 Elevated blood-pressure reading, without diagnosis of hypertension: Secondary | ICD-10-CM

## 2024-02-27 DIAGNOSIS — I10 Essential (primary) hypertension: Secondary | ICD-10-CM | POA: Insufficient documentation

## 2024-02-27 DIAGNOSIS — J45909 Unspecified asthma, uncomplicated: Secondary | ICD-10-CM | POA: Insufficient documentation

## 2024-02-27 DIAGNOSIS — M25562 Pain in left knee: Secondary | ICD-10-CM | POA: Insufficient documentation

## 2024-02-27 MED ORDER — NAPROXEN 500 MG PO TABS: 500.0000 mg | ORAL_TABLET | Freq: Two times a day (BID) | ORAL | 0 refills | Status: AC

## 2024-02-27 MED ORDER — KETOROLAC TROMETHAMINE 15 MG/ML IJ SOLN
15.0000 mg | Freq: Once | INTRAMUSCULAR | Status: AC
  Administered 2024-02-27: 15 mg via INTRAMUSCULAR
  Filled 2024-02-27: qty 1

## 2024-02-27 NOTE — ED Notes (Signed)
 Pt was instructed to follow up with his PCP for further treatment, He wants a brace. Unfortunately, due to the size of his leg, we do not have the appropriate size. Pt has been made aware.

## 2024-02-27 NOTE — ED Provider Notes (Signed)
 " [place] EMERGENCY DEPARTMENT AT [facility] Provider Note   CSN: [id number] Arrival date & time: 02/27/24  1118     Patient presents with: left knee pain    Charles Rocha is a 34 y.o. male.  Patient with chronic left knee pain, asthma, hypertension presents to emergency room with complaint of left knee pain.  She reports that for the past month has had left knee pain which is worse when he is walking.  He reports that he stood up and the pain was about his knee gave out.  He localizes his pain to posterior left knee.  He denies any new trauma but reports he hurt his knee sometime last year.  He has not had any fever.  No history of DVT.  No history of drug use.   HPI     Prior to Admission medications  Medication Sig Start Date End Date Taking? Authorizing Provider  albuterol  (PROVENTIL  HFA;VENTOLIN  HFA) 108 (90 BASE) MCG/ACT inhaler Inhale 1 puff into the lungs every 6 (six) hours as needed for wheezing or shortness of breath.    [provider]  HYDROcodone -acetaminophen  (NORCO/VICODIN) 5-325 MG per tablet Take 1 tablet by mouth every 6 (six) hours as needed for moderate pain. Patient not taking: Reported on 02/06/2014 10/22/13   Terryl Kubas, NP  traMADol  (ULTRAM ) 50 MG tablet Take 1 tablet (50 mg total) by mouth every 6 (six) hours as needed. 02/06/14   Loetta Senior, MD    Allergies: Patient has no known allergies.    Review of Systems  Musculoskeletal:  Positive for arthralgias.    Updated Vital Signs BP (!) 190/108 (BP Location: Right Arm)   Pulse 68   Temp 98.3 F (36.8 C) (Oral)   Resp 18   Ht 6' 2 (1.88 m)   Wt 104.3 kg   SpO2 99%   BMI 29.53 kg/m   Physical Exam Vitals and nursing note reviewed.  Constitutional:      General: He is not in acute distress.    Appearance: He is not toxic-appearing.  HENT:     Head: Normocephalic and atraumatic.  Eyes:     General: No scleral icterus.    Conjunctiva/sclera: Conjunctivae normal.   Cardiovascular:     Rate and Rhythm: Normal rate and regular rhythm.     Pulses: Normal pulses.     Heart sounds: Normal heart sounds.  Pulmonary:     Effort: Pulmonary effort is normal. No respiratory distress.     Breath sounds: Normal breath sounds.  Abdominal:     General: Abdomen is flat. Bowel sounds are normal.     Palpations: Abdomen is soft.     Tenderness: There is no abdominal tenderness.  Musculoskeletal:     Comments: TTP to posterior left knee, no swelling, neurovasc intact.  Skin:    General: Skin is warm and dry.     Findings: No lesion.  Neurological:     General: No focal deficit present.     Mental Status: He is alert and oriented to person, place, and time. Mental status is at baseline.     (all labs ordered are listed, but only abnormal results are displayed) Labs Reviewed - No data to display  EKG: None  Radiology: DG Knee Complete 4 Views Left Result Date: 02/27/2024 CLINICAL DATA:  Chronic left knee pain, worsening. EXAM: LEFT KNEE - COMPLETE 4+ VIEW COMPARISON:  None Available. FINDINGS: No evidence of fracture, dislocation, or joint effusion. The alignment and  joint spaces are normal. No evidence of arthropathy or other focal bone abnormality. Soft tissues are unremarkable. IMPRESSION: Negative radiographs of the left knee. Electronically Signed   By: Charles Rocha M.D.   On: 02/27/2024 12:49     Procedures   Medications Ordered in the ED  ketorolac (TORADOL) 15 MG/ML injection 15 mg (has no administration in time range)                                    Medical Decision Making Amount and/or Complexity of Data Reviewed Radiology: ordered.  Risk Prescription drug management.   This patient presents to the ED for concern of knee pain, this involves an extensive number of treatment options, and is a complaint that carries with it a high risk of complications and morbidity.  The differential diagnosis includes internal derangement, septic  joint, DVT, fracture, sprain    Imaging Studies ordered:  I ordered imaging studies including left knee x-ray  I independently visualized and interpreted imaging which showed no acute findings.  I agree with the radiologist interpretation. Does not want to wait for DVT US  here, will take 5 hours. Will call to schedule OP. No CP, SOB, cough or sign of PE.   Cardiac Monitoring: / EKG:  The patient was maintained on a cardiac monitor.     Problem List / ED Course / Critical interventions / Medication management  Patient complains of left knee pain.  He is neurovascularly intact.  He does have posterior knee pain.  I did order an ultrasound however patient does not want to stay.  His x-ray shows no fracture.  Have low suspicion for symptomatic septic joint.  No sign of infection on exam. Has asymptomatic hypertension. Not on medications, recommend diet and lifestyle change.  Follow-up with primary care for further recheck and management. I ordered medication including Toradol, provided with knee brace Reevaluation of the patient after these medicines showed that the patient improved I have reviewed the patients home medicines and have made adjustments as needed. Patient stable for discharge.  Suspect chronic left knee pain.  Will try conservative management.  Will follow-up with PCP.  Given return precautions.        Final diagnoses:  Acute pain of left knee  Elevated blood pressure reading    ED Discharge Orders          Ordered    LE Venous       Comments: IMPORTANT PATIENT INSTRUCTIONS: You have been scheduled for an Outpatient Vascular Study at [facility].  If tomorrow is a Saturday, Sunday or holiday, please go to the [facility] Emergency Department Registration Desk at 11 am tomorrow morning and tell them you are there for a vascular study.  If tomorrow is a weekday (Monday-Friday), please go to the [facility] (address [address], [place]) at 8 am and report to the 4th floor registration Zone A.  Inform registration that you are there for a vascular study.   02/27/24 1254               Demita Tobia, Warren SAILOR, PA-C 02/27/24 1258    Kingsley, Victoria K, DO 02/27/24 1456  "

## 2024-02-27 NOTE — ED Notes (Signed)
ED PA at [facility] 

## 2024-02-27 NOTE — Discharge Instructions (Addendum)
 Please take naproxen twice daily.  You can also take 1000 mg of Tylenol  every 8 hours.  Wear knee brace.  Call to schedule ultrasound. Return to ER with new or worsening symptoms.  You need to follow up with primary care doctor for elevation of high blood pressure.

## 2024-02-27 NOTE — ED Triage Notes (Signed)
 Patient reports chronic left knee pain Getting worse Pain rated 10/10

## 2024-02-27 NOTE — ED Notes (Signed)
 Knee brace not available in ED. Provider notified. Ortho notified. No braces available for patients need. Provider says no immobilizer needed so brace order will be removed.

## 2024-03-05 ENCOUNTER — Ambulatory Visit (HOSPITAL_COMMUNITY)
Admission: RE | Admit: 2024-03-05 | Discharge: 2024-03-05 | Disposition: A | Discharge status: Home / Self Care | Payer: Self-pay | Source: Ambulatory Visit | Attending: Emergency Medicine | Admitting: Emergency Medicine

## 2024-03-05 DIAGNOSIS — G8929 Other chronic pain: Secondary | ICD-10-CM | POA: Insufficient documentation

## 2024-03-05 DIAGNOSIS — M25562 Pain in left knee: Secondary | ICD-10-CM | POA: Insufficient documentation

## 2024-03-05 DIAGNOSIS — M79605 Pain in left leg: Secondary | ICD-10-CM
